# Patient Record
Sex: Male | Born: 2000
Health system: Southern US, Community
[De-identification: ages and names within clinical notes are randomized; demographics above are authoritative.]

## PROBLEM LIST (undated history)

## (undated) DIAGNOSIS — Z9109 Other allergy status, other than to drugs and biological substances: Secondary | ICD-10-CM

## (undated) DIAGNOSIS — G43909 Migraine, unspecified, not intractable, without status migrainosus: Secondary | ICD-10-CM

## (undated) HISTORY — PX: CIRCUMCISION: SUR203

---
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
## 2020-01-07 ENCOUNTER — Other Ambulatory Visit: Payer: Self-pay

## 2020-01-07 ENCOUNTER — Ambulatory Visit (INDEPENDENT_AMBULATORY_CARE_PROVIDER_SITE_OTHER): Payer: Managed Care, Other (non HMO) | Admitting: Family Medicine

## 2020-01-07 ENCOUNTER — Encounter: Payer: Self-pay | Admitting: Family Medicine

## 2020-01-07 VITALS — BP 97/63 | HR 64 | Temp 98.2°F | Ht 73.0 in | Wt 129.0 lb

## 2020-01-07 DIAGNOSIS — F321 Major depressive disorder, single episode, moderate: Secondary | ICD-10-CM

## 2020-01-07 NOTE — Progress Notes (Signed)
BP 97/63   Pulse 64   Temp 98.2 F (36.8 C)   Ht 6\' 1"  (1.854 m)   Wt 129 lb (58.5 kg)   SpO2 97%   BMI 17.02 kg/m    Subjective:   Patient ID: Richard Morales, male    DOB: [birth year], 19 y.o.   MRN: [id number]  HPI: Richard Morales is a 19 y.o. male presenting on 01/07/2020 for Medical Management of Chronic Issues and Depression   HPI Patient is coming in for recheck of depression and anxiety. Patient is coming in for recheck on the Zoloft and he has been on it 2 weeks and taking it every day and he feels like he is doing a lot better.  He has a lot more positive outlook on work and life and feeling a lot less stress and feels like he is doing a lot better.  He would like to stay on the current medication and has not had to use the hydroxyzine at all. Depression screen [facility] 2/9 01/07/2020 12/26/2019 08/20/2017 11/06/2016 06/14/2016  Decreased Interest 2 3 0 0 0  Down, Depressed, Hopeless 1 2 0 0 0  PHQ - 2 Score 3 5 0 0 0  Altered sleeping 1 1 - 0 0  Tired, decreased energy 1 1 - 0 0  Change in appetite 2 2 - 0 0  Feeling bad or failure about yourself  1 2 - 0 0  Trouble concentrating 2 3 - 0 0  Moving slowly or fidgety/restless 0 0 - 0 0  Suicidal thoughts 1 1 - 0 0  PHQ-9 Score 11 15 - 0 0     Relevant past medical, surgical, family and social history reviewed and updated as indicated. Interim medical history since our last visit reviewed. Allergies and medications reviewed and updated.  Review of Systems  Constitutional: Negative for chills and fever.  Respiratory: Negative for shortness of breath and wheezing.   Cardiovascular: Negative for chest pain and leg swelling.  Musculoskeletal: Negative for back pain and gait problem.  Skin: Negative for rash.  Psychiatric/Behavioral: Positive for dysphoric mood. Negative for decreased concentration, self-injury, sleep disturbance and suicidal ideas. The patient is nervous/anxious.   All other systems reviewed and are  negative.   Per HPI unless specifically indicated above   Allergies as of 01/07/2020      Reactions   Amoxicillin Hives   Other    Seasonal Allergies      Medication List       Accurate as of January 07, 2020  4:33 PM. If you have any questions, ask your nurse or doctor.        hydrOXYzine 25 MG tablet Commonly known as: ATARAX/VISTARIL Take 1 tablet (25 mg total) by mouth 3 (three) times daily as needed.   sertraline 50 MG tablet Commonly known as: Zoloft Take 1 tablet (50 mg total) by mouth daily.        Objective:   BP 97/63   Pulse 64   Temp 98.2 F (36.8 C)   Ht 6\' 1"  (1.854 m)   Wt 129 lb (58.5 kg)   SpO2 97%   BMI 17.02 kg/m   Wt Readings from Last 3 Encounters:  01/07/20 129 lb (58.5 kg) (13 %, Z= -1.14)*  12/26/19 130 lb (59 kg) (14 %, Z= -1.08)*  05/07/18 133 lb 6 oz (60.5 kg) (30 %, Z= -0.52)*   * Growth percentiles are based on CDC (Boys, 2-20 Years) data.    Physical  Exam Vitals and nursing note reviewed.  Constitutional:      General: He is not in acute distress.    Appearance: He is well-developed. He is not diaphoretic.  Eyes:     General: No scleral icterus.    Conjunctiva/sclera: Conjunctivae normal.  Neck:     Thyroid: No thyromegaly.  Neurological:     Mental Status: He is alert and oriented to person, place, and time.     Coordination: Coordination normal.  Psychiatric:        Mood and Affect: Mood is anxious and depressed.        Behavior: Behavior normal.        Thought Content: Thought content does not include suicidal ideation. Thought content does not include suicidal plan.       Assessment & Plan:   Problem List Items Addressed This Visit    None    Visit Diagnoses    Depression, major, single episode, moderate ([place])    -  Primary      Doing better, continue current medication Follow up plan: Return in about 4 weeks (around 02/04/2020), or if symptoms worsen or fail to improve, for Depression  recheck.  Counseling provided for all of the vaccine components No orders of the defined types were placed in this encounter.   Caryl Pina, MD [facility] 01/07/2020, 4:33 PM

## 2020-02-09 ENCOUNTER — Ambulatory Visit: Payer: Managed Care, Other (non HMO) | Admitting: Family Medicine

## 2020-02-11 ENCOUNTER — Encounter: Payer: Self-pay | Admitting: Family Medicine

## 2020-03-22 ENCOUNTER — Other Ambulatory Visit: Payer: Managed Care, Other (non HMO)

## 2020-03-22 DIAGNOSIS — Z20822 Contact with and (suspected) exposure to covid-19: Secondary | ICD-10-CM

## 2020-03-23 ENCOUNTER — Other Ambulatory Visit: Payer: 59

## 2020-03-25 LAB — NOVEL CORONAVIRUS, NAA: SARS-CoV-2, NAA: DETECTED — AB

## 2022-11-08 ENCOUNTER — Ambulatory Visit: Payer: Managed Care, Other (non HMO) | Admitting: Nurse Practitioner

## 2022-11-08 NOTE — Progress Notes (Deleted)
Established Patient Office Visit  Subjective   Patient ID: Richard Morales, male    DOB: [birth year]  Age: 22 y.o. MRN: [id number]  No chief complaint on file.   HPI  Patient Active Problem List   Diagnosis Date Noted   Hemiplegic migraine without status migrainosus, not intractable 06/04/2015   Transient alteration of awareness 06/04/2015   Partial seizures with loss of consciousness (HCC) 01/04/2015   Migraines 12/23/2014   Past Medical History:  Diagnosis Date   Environmental allergies    Migraine    Past Surgical History:  Procedure Laterality Date   CIRCUMCISION     SCROTAL EXPLORATION Right 06/14/2016   Procedure: SCROTUM EXPLORATION WITH LEFT ORCHIOPEXY AND RIGHT ORCHIECTOMY;  Surgeon: Heloise Purpura, MD;  Location: [facility] OR;  Service: Urology;  Laterality: Right;   Social History   Tobacco Use   Smoking status: Never   Smokeless tobacco: Never  Substance Use Topics   Alcohol use: No   Drug use: No   Social History   Socioeconomic History   Marital status: Single    Spouse name: Not on file   Number of children: Not on file   Years of education: Not on file   Highest education level: Not on file  Occupational History   Not on file  Tobacco Use   Smoking status: Never   Smokeless tobacco: Never  Substance and Sexual Activity   Alcohol use: No   Drug use: No   Sexual activity: Never  Other Topics Concern   Not on file  Social History Narrative   Martell is in 9 th grade. He is home schooled at [employer]. He is learning how to be more independent this school year. Lendon is more  Involved this year,  in activities outside of the home.   [place] lives with both parents, two younger brothers, and a younger sister.    Social Determinants of Health   Financial Resource Strain: Not on file  Food Insecurity: Not on file  Transportation Needs: Not on file  Physical Activity: Not on file  Stress: Not on file  Social Connections: Not on file  Intimate  Partner Violence: Not on file   Family Status  Relation Name Status   Father  Alive   MGM  Alive   MGF  Alive   PGM  Alive   PGF  Alive   Mother  Alive   Sister Younger- 62 yo Alive   Brother Younger 4 yo Alive   Brother Younger- 5 yo Alive   Cousin Maternal 2nd Alive  No partnership data on file   Family History  Problem Relation Age of Onset   Hypertension Father    Migraines Father    Thyroid disease Maternal Grandmother    Hyperlipidemia Maternal Grandmother    Hyperlipidemia Maternal Grandfather    Hypertension Paternal Grandmother    Migraines Paternal Grandmother    Hypertension Paternal Grandfather    Cancer Paternal Grandfather    Migraines Mother    ADD / ADHD Brother    Autism Cousin    Allergies  Allergen Reactions   Amoxicillin Hives   Other     Seasonal Allergies      ROS Negative unless indicated in HPI   Objective:     There were no vitals taken for this visit. BP Readings from Last 3 Encounters:  01/07/20 97/63  12/26/19 120/79  05/07/18 (!) 97/64 (2%, Z = -2.05 /  29%, Z = -0.55)*   *BP  percentiles are based on the 2017 AAP Clinical Practice Guideline for boys   Wt Readings from Last 3 Encounters:  01/07/20 129 lb (58.5 kg) (13%, Z= -1.14)*  12/26/19 130 lb (59 kg) (14%, Z= -1.08)*  05/07/18 133 lb 6 oz (60.5 kg) (30%, Z= -0.52)*   * Growth percentiles are based on CDC (Boys, 2-20 Years) data.      Physical Exam   No results found for any visits on 11/08/22.  Last CBC Lab Results  Component Value Date   WBC 3.8 12/26/2019   HGB 15.6 12/26/2019   HCT 46.7 12/26/2019   MCV 84 12/26/2019   MCH 28.1 12/26/2019   RDW 13.1 12/26/2019   PLT 283 12/26/2019   Last metabolic panel Lab Results  Component Value Date   GLUCOSE 77 12/26/2019   NA 139 12/26/2019   K 4.2 12/26/2019   CL 101 12/26/2019   CO2 25 12/26/2019   BUN 16 12/26/2019   CREATININE 1.05 12/26/2019   GFRNONAA 103 12/26/2019   CALCIUM 9.6 12/26/2019   PROT  6.9 12/26/2019   ALBUMIN 5.0 12/26/2019   LABGLOB 1.9 12/26/2019   AGRATIO 2.6 (H) 12/26/2019   BILITOT 1.9 (H) 12/26/2019   ALKPHOS 51 (L) 12/26/2019   AST 23 12/26/2019   ALT 13 12/26/2019   ANIONGAP 12 08/16/2017   Last lipids No results found for: "CHOL", "HDL", "LDLCALC", "LDLDIRECT", "TRIG", "CHOLHDL"      Assessment & Plan:  There are no diagnoses linked to this encounter.  No follow-ups on file.   [name], DNP [facility] [address] [place], [place] [address] [phone]

## 2023-04-25 ENCOUNTER — Encounter: Payer: Self-pay | Admitting: Family Medicine

## 2023-05-17 ENCOUNTER — Encounter: Payer: Self-pay | Admitting: Family Medicine

## 2023-05-21 ENCOUNTER — Encounter: Payer: Self-pay | Admitting: Family Medicine

## 2023-05-21 ENCOUNTER — Ambulatory Visit (INDEPENDENT_AMBULATORY_CARE_PROVIDER_SITE_OTHER): Payer: 59

## 2023-05-21 VITALS — BP 128/81 | HR 80 | Ht 73.0 in | Wt 128.0 lb

## 2023-05-21 DIAGNOSIS — F321 Major depressive disorder, single episode, moderate: Secondary | ICD-10-CM

## 2023-05-21 DIAGNOSIS — Z13828 Encounter for screening for other musculoskeletal disorder: Secondary | ICD-10-CM

## 2023-05-21 DIAGNOSIS — F419 Anxiety disorder, unspecified: Secondary | ICD-10-CM

## 2023-05-21 DIAGNOSIS — F32A Depression, unspecified: Secondary | ICD-10-CM

## 2023-05-21 DIAGNOSIS — Z130 Encounter for screening for diseases of the blood and blood-forming organs and certain disorders involving the immune mechanism: Secondary | ICD-10-CM

## 2023-05-21 MED ORDER — CITALOPRAM HYDROBROMIDE 20 MG PO TABS: 20.0000 mg | ORAL_TABLET | Freq: Every day | ORAL | 5 refills | Status: DC

## 2023-05-21 NOTE — Patient Instructions (Addendum)
Help is available Speak with someone today 87 Suicide and Crisis Lifeline Languages: English, Spanish Hours: Available 24 hours

## 2023-05-21 NOTE — Progress Notes (Signed)
BP 128/81   Pulse 80   Ht 6\' 1"  (1.854 m)   Wt 128 lb (58.1 kg)   SpO2 99%   BMI 16.89 kg/m    Subjective:   Patient ID: Richard Morales, male    DOB: 02-25-2001, 23 y.o.   MRN: [id number]  HPI: Richard Morales is a 23 y.o. male presenting on 05/21/2023 for Anxiety, Depression, and Agitation   HPI Anxiety and depression and agitation Patient is coming in today with complaints of anxiety and depression and agitation.  He says this is something that he has dealt with a little bit through his life but it is really increased over the past 6 months.  He says he is in a relationship currently with this girl that he has liked for many years but that she was dating long-term one of his friends and he was kind of unfortunately the middle man and that relationship for a long time all day had their relationship but they recently broke up and then he has been getting back together with her and now she is pregnant with his child and she is 4 months pregnant and so that is something that is very happy about but he still gets these flashes of the motions of remembering her being with his best friend who is her ex and that causes him to feel a lot of anger and agitation and feel the emotions building up.  He has had some instances currently send most recently 2 days ago will his emotions broke up to the point where he was having suicidal ideations.  He called his parents and his mom came down, able to help calm him back down and he is not having any suicidal ideations today.  He does have plans to see a counselor on Wednesday and has an appointment with them as well.  Relevant past medical, surgical, family and social history reviewed and updated as indicated. Interim medical history since our last visit reviewed. Allergies and medications reviewed and updated.  Review of Systems  Constitutional:  Positive for appetite change. Negative for chills and fever.  Eyes:  Negative for visual disturbance.   Respiratory:  Negative for shortness of breath and wheezing.   Cardiovascular:  Negative for chest pain and leg swelling.  Musculoskeletal:  Negative for back pain and gait problem.  Skin:  Negative for rash.  Psychiatric/Behavioral:  Positive for behavioral problems, dysphoric mood and sleep disturbance. Negative for self-injury and suicidal ideas. The patient is nervous/anxious.   All other systems reviewed and are negative.   Per HPI unless specifically indicated above   Allergies as of 05/21/2023       Reactions   Amoxicillin Hives   Other    Seasonal Allergies        Medication List        Accurate as of May 21, 2023 10:13 AM. If you have any questions, ask your nurse or doctor.          STOP taking these medications    sertraline 50 MG tablet Commonly known as: Zoloft Stopped by: Elige Radon Idara [place]       TAKE these medications    citalopram 20 MG tablet Commonly known as: CeleXA Take 1 tablet (20 mg total) by mouth daily. Started by: Elige Radon Alize Acy   hydrOXYzine 25 MG tablet Commonly known as: ATARAX Take 1 tablet (25 mg total) by mouth 3 (three) times daily as needed.         Objective:  BP 128/81   Pulse 80   Ht 6\' 1"  (1.854 m)   Wt 128 lb (58.1 kg)   SpO2 99%   BMI 16.89 kg/m   Wt Readings from Last 3 Encounters:  05/21/23 128 lb (58.1 kg)  01/07/20 129 lb (58.5 kg) (13%, Z= -1.14)*  12/26/19 130 lb (59 kg) (14%, Z= -1.08)*   * Growth percentiles are based on CDC (Boys, 2-20 Years) data.    Physical Exam Vitals and nursing note reviewed.  Constitutional:      General: He is not in acute distress.    Appearance: He is well-developed. He is not diaphoretic.  Eyes:     General: No scleral icterus.    Conjunctiva/sclera: Conjunctivae normal.  Neck:     Thyroid: No thyromegaly.  Cardiovascular:     Rate and Rhythm: Normal rate and regular rhythm.     Heart sounds: Normal heart sounds. No murmur heard. Pulmonary:      Effort: Pulmonary effort is normal. No respiratory distress.     Breath sounds: Normal breath sounds. No wheezing.  Musculoskeletal:        General: No swelling. Normal range of motion.     Cervical back: Neck supple.  Lymphadenopathy:     Cervical: No cervical adenopathy.  Skin:    General: Skin is warm and dry.     Findings: No rash.  Neurological:     Mental Status: He is alert and oriented to person, place, and time.     Coordination: Coordination normal.  Psychiatric:        Mood and Affect: Mood is anxious and depressed. Affect is tearful.        Behavior: Behavior is agitated.        Thought Content: Thought content does not include suicidal ideation. Thought content does not include suicidal plan.       Assessment & Plan:   Problem List Items Addressed This Visit   None Visit Diagnoses       Anxiety and depression    -  Primary   Relevant Medications   citalopram (CELEXA) 20 MG tablet   Other Relevant Orders   CBC with Differential/Platelet   CMP14+EGFR   Lipid panel   TSH     Screening for sickle-cell disease or trait       Relevant Orders   Sickle cell screen     Encounter for screening for other musculoskeletal disorder       Relevant Orders   Spinal Muscular Atrophy (SMA)       Patient seems to be starting along with anxiety depression and emotions.  Will give him Celexa and also give hydroxyzine that he can use as needed to help with sleep for anxiety and agitation.  Will do blood work as well today. Follow up plan: Return if symptoms worsen or fail to improve, for 3 to 4-week anxiety depression follow-up.  Counseling provided for all of the vaccine components Orders Placed This Encounter  Procedures   CBC with Differential/Platelet   CMP14+EGFR   Lipid panel   TSH   Sickle cell screen   Spinal Muscular Atrophy (SMA)    Arville Care, MD [facility] 05/21/2023, 10:13 AM

## 2023-05-22 MED ORDER — HYDROXYZINE HCL 25 MG PO TABS: 25.0000 mg | ORAL_TABLET | Freq: Three times a day (TID) | ORAL | 0 refills | Status: DC | PRN

## 2023-05-22 NOTE — Addendum Note (Signed)
Addended by: Ignacia Bayley on: 05/22/2023 04:12 PM   Modules accepted: Orders

## 2023-05-23 ENCOUNTER — Encounter: Payer: Self-pay | Admitting: Family Medicine

## 2023-05-23 MED ORDER — HYDROXYZINE PAMOATE 25 MG PO CAPS: 25.0000 mg | ORAL_CAPSULE | Freq: Three times a day (TID) | ORAL | 1 refills | Status: DC | PRN

## 2023-05-25 LAB — CMP14+EGFR
ALT: 13 [IU]/L (ref 0–44)
AST: 20 [IU]/L (ref 0–40)
Albumin: 4.8 g/dL (ref 4.3–5.2)
Alkaline Phosphatase: 46 [IU]/L (ref 44–121)
BUN/Creatinine Ratio: 11 (ref 9–20)
BUN: 12 mg/dL (ref 6–20)
Bilirubin Total: 1.9 mg/dL — ABNORMAL HIGH (ref 0.0–1.2)
CO2: 21 mmol/L (ref 20–29)
Calcium: 9.4 mg/dL (ref 8.7–10.2)
Chloride: 101 mmol/L (ref 96–106)
Creatinine, Ser: 1.14 mg/dL (ref 0.76–1.27)
Globulin, Total: 2 g/dL (ref 1.5–4.5)
Glucose: 80 mg/dL (ref 70–99)
Potassium: 4.7 mmol/L (ref 3.5–5.2)
Sodium: 138 mmol/L (ref 134–144)
Total Protein: 6.8 g/dL (ref 6.0–8.5)
eGFR: 93 mL/min/{1.73_m2} (ref >59)

## 2023-05-25 LAB — SPINAL MUSCULAR ATROPHY (SMA)

## 2023-05-25 LAB — CBC WITH DIFFERENTIAL/PLATELET
Basophils Absolute: 0 10*3/uL (ref 0.0–0.2)
Basos: 0 %
EOS (ABSOLUTE): 0.2 10*3/uL (ref 0.0–0.4)
Eos: 3 %
Hematocrit: 47.9 % (ref 37.5–51.0)
Hemoglobin: 16.5 g/dL (ref 13.0–17.7)
Immature Grans (Abs): 0 10*3/uL (ref 0.0–0.1)
Immature Granulocytes: 0 %
Lymphocytes Absolute: 1.1 10*3/uL (ref 0.7–3.1)
Lymphs: 22 %
MCH: 29.1 pg (ref 26.6–33.0)
MCHC: 34.4 g/dL (ref 31.5–35.7)
MCV: 85 fL (ref 79–97)
Monocytes Absolute: 0.3 10*3/uL (ref 0.1–0.9)
Monocytes: 5 %
Neutrophils Absolute: 3.4 10*3/uL (ref 1.4–7.0)
Neutrophils: 70 %
Platelets: 316 10*3/uL (ref 150–450)
RBC: 5.67 x10E6/uL (ref 4.14–5.80)
RDW: 12.5 % (ref 11.6–15.4)
WBC: 4.9 10*3/uL (ref 3.4–10.8)

## 2023-05-25 LAB — SICKLE CELL SCREEN: Sickle Cell Screen: NEGATIVE

## 2023-05-25 LAB — TSH: TSH: 0.383 u[IU]/mL — ABNORMAL LOW (ref 0.450–4.500)

## 2023-05-25 LAB — LIPID PANEL
Chol/HDL Ratio: 3.5 {ratio} (ref 0.0–5.0)
Cholesterol, Total: 181 mg/dL (ref 100–199)
HDL: 51 mg/dL (ref >39)
LDL Chol Calc (NIH): 117 mg/dL — ABNORMAL HIGH (ref 0–99)
Triglycerides: 67 mg/dL (ref 0–149)
VLDL Cholesterol Cal: 13 mg/dL (ref 5–40)

## 2023-05-28 ENCOUNTER — Encounter: Payer: Self-pay | Admitting: Family Medicine

## 2023-06-15 ENCOUNTER — Ambulatory Visit: Payer: 59 | Admitting: Family Medicine

## 2023-06-20 ENCOUNTER — Encounter: Payer: Self-pay | Admitting: Family Medicine

## 2023-08-16 ENCOUNTER — Ambulatory Visit: Payer: Self-pay

## 2023-08-16 NOTE — Telephone Encounter (Signed)
 Chief Complaint: mental health issue Symptoms: depression, anxiety, insomnia Frequency: worsening x 3-6 months Pertinent Negatives: Patient denies difficulty breathing, lightheaded/dizziness Disposition: [] ED /[] Urgent Care (no appt availability in office) / [x] Appointment(In office/virtual)/ []  [facility]/ [] Home Care/ [] Refused Recommended Disposition /[] [facility]/ []  Follow-up with PCP Additional Notes: Patient stopped taking his SSRI's and feels depression and anxiety. Patient states he has no thoughts or plans at this time to harm himself. Patient agreeable to acute visit and no available appts with PCP. Patient scheduled for acute visit tomorrow with DOD.  Reason for Triage: Patient wants to  know if there is something that can be prescribed to him for his mental concerns he's having.    Reason for Disposition  MODERATE anxiety (e.g., persistent or frequent anxiety symptoms; interferes with sleep, school, or work)  Answer Assessment - Initial Assessment Questions 1. CONCERN: "Did anything happen that prompted you to call today?"      Patient states it feels like his thoughts take over and it is overwhelming. He feels attacked or judged when he is in that head space. He states fluctuates between really high highs and low depressing days. He states today is a low day.  2. ANXIETY SYMPTOMS: "Can you describe how you (your loved one; patient) have been feeling?" (e.g., tense, restless, panicky, anxious, keyed up, overwhelmed, sense of impending doom).      Anxious, restless, unable to sleep.  3. ONSET: "How long have you been feeling this way?" (e.g., hours, days, weeks)     Years. Worsening in the last 3-6 months.  4. SEVERITY: "How would you rate the level of anxiety?" (e.g., 0 - 10; or mild, moderate, severe).     Moderate to severe. He states it has gotten better being on the phone with "his lady" and after speaking to triage RN.  5. FUNCTIONAL IMPAIRMENT:  "How have these feelings affected your ability to do daily activities?" "Have you had more difficulty than usual doing your normal daily activities?" (e.g., getting better, same, worse; self-care, school, work, interactions)     Patient states he was not able to sleep last night and he overslept his alarm this morning.  6. HISTORY: "Have you felt this way before?" "Have you ever been diagnosed with an anxiety problem in the past?" (e.g., generalized anxiety disorder, panic attacks, PTSD). If Yes, ask: "How was this problem treated?" (e.g., medicines, counseling, etc.)     Yes, patient with history of anxiety and depression.   7. RISK OF HARM - SUICIDAL IDEATION: "Do you ever have thoughts of hurting or killing yourself?" If Yes, ask:  "Do you have these feelings now?" "Do you have a plan on how you would do this?"     He states he has no plans or thoughts at this time to harm himself but he states there are feelings like "if everything goes and hits the fan there is a way out".  8. TREATMENT:  "What has been done so far to treat this anxiety?" (e.g., medicines, relaxation strategies). "What has helped?"     He states he has been placed on SSRI's in the past but he states he can't stand being on them. He states his brain is off the wall when he isn't on them but when he is on SSRI's he feels like 85% of his emotions are null and void.  9. TREATMENT - THERAPIST: "Do you have a counselor or therapist? Name?"     Denies.  10. POTENTIAL  TRIGGERS: "Do you drink caffeinated beverages (e.g., coffee, colas, teas), and how much daily?" "Do you drink alcohol or use any drugs?" "Have you started any new medicines recently?"       Yes, he states he drinks sodas about 160mg  of caffeine daily. He denies drinking alcohol but states he smokes weed.  11. PATIENT SUPPORT: "Who is with you now?" "Who do you live with?" "Do you have family or friends who you can talk to?"        Patient states he is at work right now  and has a partner he lives with.  12. OTHER SYMPTOMS: "Do you have any other symptoms?" (e.g., feeling depressed, trouble concentrating, trouble sleeping, trouble breathing, palpitations or fast heartbeat, chest pain, sweating, nausea, or diarrhea)       Insomnia/difficulty sleeping.  13. PREGNANCY: "Is there any chance you are pregnant?" "When was your last menstrual period?"       N/A.  Protocols used: Anxiety and Panic Attack-A-AH

## 2023-08-16 NOTE — Telephone Encounter (Signed)
 Reason for Triage: Patient wants to  know if there is something that can be prescribed to him for his mental concerns he's having.    Voice mailbox is full, unable to leave a message.

## 2023-08-17 ENCOUNTER — Encounter: Payer: Self-pay | Admitting: Nurse Practitioner

## 2023-08-17 ENCOUNTER — Ambulatory Visit (INDEPENDENT_AMBULATORY_CARE_PROVIDER_SITE_OTHER): Admitting: Nurse Practitioner

## 2023-08-17 VITALS — BP 125/84 | HR 54 | Temp 98.2°F | Ht 73.0 in | Wt 126.0 lb

## 2023-08-17 DIAGNOSIS — F419 Anxiety disorder, unspecified: Secondary | ICD-10-CM

## 2023-08-17 DIAGNOSIS — F32A Depression, unspecified: Secondary | ICD-10-CM | POA: Diagnosis not present

## 2023-08-17 MED ORDER — ESCITALOPRAM OXALATE 10 MG PO TABS: 10.0000 mg | ORAL_TABLET | Freq: Every day | ORAL | 1 refills | Status: AC

## 2023-08-17 MED ORDER — BUSPIRONE HCL 15 MG PO TABS: 15.0000 mg | ORAL_TABLET | Freq: Two times a day (BID) | ORAL | 1 refills | Status: AC

## 2023-08-17 NOTE — Progress Notes (Signed)
 Subjective:    Patient ID: Richard Morales, male    DOB: 03/08/2001, 23 y.o.   MRN: [id number]   Chief Complaint: depression  Anxiety Symptoms include nervous/anxious behavior.    Depression        Past medical history includes anxiety.     Patient comes in  today to discuss his depression. He is currently on celexa  20mg  daily and takes hydroxyzine  on as needed basis. He just started on meds in February. At start of meds he had told DR Dettinger that he was having suicidal thoughts. Under a lot of stress because his girlfriend was pregnant. He was suppose to start counseling , which he was seeking on his own. He stopped celexa  because it made him have no emotions at all. He says that zoloft  did him the same way in the past.    05/21/2023   10:13 AM 01/07/2020    4:10 PM 12/26/2019    4:13 PM  Depression screen PHQ 2/9  Decreased Interest 1 2 3   Down, Depressed, Hopeless 2 1 2   PHQ - 2 Score 3 3 5   Altered sleeping 2 1 1   Tired, decreased energy 2 1 1   Change in appetite 2 2 2   Feeling bad or failure about yourself  2 1 2   Trouble concentrating 3 2 3   Moving slowly or fidgety/restless 1 0 0  Suicidal thoughts 2 1 1   PHQ-9 Score 17 11 15   Difficult doing work/chores Very difficult        05/21/2023   10:14 AM 01/07/2020    4:10 PM 12/26/2019    4:14 PM  GAD 7 : Generalized Anxiety Score  Nervous, Anxious, on Edge 2 0 0  Control/stop worrying 2 0 0  Worry too much - different things 2 0 0  Trouble relaxing 1 1 1   Restless 2 1   Easily annoyed or irritable 2 3 3   Afraid - awful might happen 2 0 0  Total GAD 7 Score 13 5   Anxiety Difficulty Very difficult       Patient Active Problem List   Diagnosis Date Noted   Hemiplegic migraine without status migrainosus, not intractable 06/04/2015   Transient alteration of awareness 06/04/2015   Partial seizures with loss of consciousness (HCC) 01/04/2015   Migraines 12/23/2014       Review of Systems   Psychiatric/Behavioral:  Positive for depression. The patient is nervous/anxious.        Objective:   Physical Exam Constitutional:      Appearance: Normal appearance.  Cardiovascular:     Rate and Rhythm: Normal rate and regular rhythm.     Heart sounds: Normal heart sounds.  Pulmonary:     Effort: Pulmonary effort is normal.     Breath sounds: Normal breath sounds.  Skin:    General: Skin is warm.  Neurological:     General: No focal deficit present.     Mental Status: He is alert and oriented to person, place, and time.  Psychiatric:        Mood and Affect: Mood normal.        Behavior: Behavior normal.     Comments: Very anxious today Good eye contact Answers all questions appropriately    BP 125/84   Pulse (!) 54   Temp 98.2 F (36.8 C) (Temporal)   Ht 6\' 1"  (1.854 m)   Wt 126 lb (57.2 kg)   SpO2 97%   BMI 16.62 kg/m  Assessment & Plan:  Richard Morales in today with chief complaint of Anxiety and Depression   1. Anxiety and depression (Primary) Stress management Counseling will help Follow up with DR Dettinger in 3 weeks - escitalopram (LEXAPRO) 10 MG tablet; Take 1 tablet (10 mg total) by mouth daily.  Dispense: 90 tablet; Refill: 1 - busPIRone (BUSPAR) 15 MG tablet; Take 1 tablet (15 mg total) by mouth 2 (two) times daily.  Dispense: 30 tablet; Refill: 1    The above assessment and management plan was discussed with the patient. The patient verbalized understanding of and has agreed to the management plan. Patient is aware to call the clinic if symptoms persist or worsen. Patient is aware when to return to the clinic for a follow-up visit. Patient educated on when it is appropriate to go to the emergency department.   Mary-Margaret Gaylyn Keas, FNP

## 2023-08-17 NOTE — Patient Instructions (Signed)

## 2023-09-13 ENCOUNTER — Ambulatory Visit: Admitting: Family Medicine

## 2023-09-13 ENCOUNTER — Encounter: Payer: Self-pay | Admitting: Family Medicine
